# Patient Record
Sex: Female | Born: 1985 | Race: White | Hispanic: No | Marital: Single
Health system: Southern US, Community
[De-identification: ages and names within clinical notes are randomized; demographics above are authoritative.]

---
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
## 2020-01-31 ENCOUNTER — Other Ambulatory Visit: Payer: Self-pay

## 2020-01-31 ENCOUNTER — Ambulatory Visit (INDEPENDENT_AMBULATORY_CARE_PROVIDER_SITE_OTHER): Payer: Self-pay | Admitting: Otolaryngology

## 2020-01-31 DIAGNOSIS — R42 Dizziness and giddiness: Secondary | ICD-10-CM

## 2020-01-31 NOTE — Progress Notes (Signed)
HPI: Chelsea Campos is a 34 y.o. female who presents is referred by [facility] physicians for evaluation of dizziness.  Symptoms initially began following a flight to [place] that caused her to get motion sickness on 11/24/2019.  After she returned home she had persistent problems with dizziness with some nausea and tinnitus at night.  She describes some sensation of the room spinning.  She has undergone physical therapy and is doing better presently..  She is not having any hearing problems.  She has been treated with antibiotics as well as steroids in the past with minimal benefit. She is otherwise healthy.  No past medical history on file.  Social History   Socioeconomic History  . Marital status: Single    Spouse name: Not on file  . Number of children: Not on file  . Years of education: Not on file  . Highest education level: Not on file  Occupational History  . Not on file  Tobacco Use  . Smoking status: Not on file  Substance and Sexual Activity  . Alcohol use: Not on file  . Drug use: Not on file  . Sexual activity: Not on file  Other Topics Concern  . Not on file  Social History Narrative  . Not on file   Social Determinants of Health   Financial Resource Strain:   . Difficulty of Paying Living Expenses: Not on file  Food Insecurity:   . Worried About Programme researcher, broadcasting/film/video in the Last Year: Not on file  . Ran Out of Food in the Last Year: Not on file  Transportation Needs:   . Lack of Transportation (Medical): Not on file  . Lack of Transportation (Non-Medical): Not on file  Physical Activity:   . Days of Exercise per Week: Not on file  . Minutes of Exercise per Session: Not on file  Stress:   . Feeling of Stress : Not on file  Social Connections:   . Frequency of Communication with Friends and Family: Not on file  . Frequency of Social Gatherings with Friends and Family: Not on file  . Attends Religious Services: Not on file  . Active Member of Clubs or  Organizations: Not on file  . Attends Banker Meetings: Not on file  . Marital Status: Not on file   No family history on file. Not on File Prior to Admission medications   Not on File     Positive ROS: Otherwise negative  All other systems have been reviewed and were otherwise negative with the exception of those mentioned in the HPI and as above.  Physical Exam: Constitutional: Alert, well-appearing, no acute distress Ears: External ears without lesions or tenderness. Ear canals are clear bilaterally.  On microscopic exam of the ears TMs were clear bilaterally with no middle ear space abnormality noted.  TMs had normal mobility tympanometry.  Hearing screening with a tuning forks revealed good hearing in both ears which was symmetric.  Dix-Hallpike testing revealed no evidence of nystagmus or vertigo. Nasal: External nose without lesions.. Clear nasal passages Oral: Lips and gums without lesions. Tongue and palate mucosa without lesions. Posterior oropharynx clear. Neck: No palpable adenopathy or masses Respiratory: Breathing comfortably  Skin: No facial/neck lesions or rash noted.  Procedures  Assessment: Dizziness.  Could have been a mild labyrinthitis.  Plan: Did not recommend any further specific therapy at this point outside of physical therapy until symptoms improve.   Narda Bonds, MD   CC:

## 2020-06-05 ENCOUNTER — Other Ambulatory Visit: Payer: Self-pay | Admitting: Physician Assistant

## 2020-06-07 ENCOUNTER — Other Ambulatory Visit: Payer: Self-pay | Admitting: Physician Assistant

## 2020-06-07 DIAGNOSIS — R109 Unspecified abdominal pain: Secondary | ICD-10-CM

## 2020-06-08 ENCOUNTER — Other Ambulatory Visit: Payer: Self-pay

## 2020-06-09 ENCOUNTER — Other Ambulatory Visit: Payer: Self-pay | Admitting: Physician Assistant

## 2020-06-09 ENCOUNTER — Ambulatory Visit
Admission: RE | Admit: 2020-06-09 | Discharge: 2020-06-09 | Disposition: A | Discharge status: Home / Self Care | Payer: No Typology Code available for payment source | Source: Ambulatory Visit | Attending: Physician Assistant | Admitting: Physician Assistant

## 2020-06-09 DIAGNOSIS — R109 Unspecified abdominal pain: Secondary | ICD-10-CM

## 2020-06-12 ENCOUNTER — Other Ambulatory Visit: Payer: Self-pay | Admitting: Physician Assistant

## 2020-06-12 DIAGNOSIS — R109 Unspecified abdominal pain: Secondary | ICD-10-CM

## 2020-09-19 ENCOUNTER — Other Ambulatory Visit: Payer: Self-pay | Admitting: Family Medicine

## 2020-09-19 DIAGNOSIS — E041 Nontoxic single thyroid nodule: Secondary | ICD-10-CM

## 2020-09-28 ENCOUNTER — Ambulatory Visit
Admission: RE | Admit: 2020-09-28 | Discharge: 2020-09-28 | Disposition: A | Discharge status: Home / Self Care | Payer: No Typology Code available for payment source | Source: Ambulatory Visit | Attending: Family Medicine | Admitting: Family Medicine

## 2020-09-28 DIAGNOSIS — E041 Nontoxic single thyroid nodule: Secondary | ICD-10-CM

## 2020-10-03 ENCOUNTER — Other Ambulatory Visit: Payer: Self-pay | Admitting: Family Medicine

## 2020-10-03 DIAGNOSIS — E041 Nontoxic single thyroid nodule: Secondary | ICD-10-CM

## 2021-02-20 ENCOUNTER — Other Ambulatory Visit: Payer: Self-pay

## 2021-05-08 ENCOUNTER — Ambulatory Visit
Admission: RE | Admit: 2021-05-08 | Discharge: 2021-05-08 | Disposition: A | Discharge status: Home / Self Care | Payer: No Typology Code available for payment source | Source: Ambulatory Visit | Attending: Family Medicine | Admitting: Family Medicine

## 2021-05-08 DIAGNOSIS — E041 Nontoxic single thyroid nodule: Secondary | ICD-10-CM

## 2021-05-09 ENCOUNTER — Other Ambulatory Visit: Payer: Self-pay | Admitting: Family Medicine

## 2021-05-09 DIAGNOSIS — E049 Nontoxic goiter, unspecified: Secondary | ICD-10-CM

## 2021-10-30 IMAGING — CT CT ABD-PELV W/O CM
2 of 4 series · 13 of 46 positions shown, 15 images · non-contrast
Comparison: None.

CLINICAL DATA: Right flank pain for 2 weeks.

EXAM:
CT ABDOMEN AND PELVIS WITHOUT CONTRAST
TECHNIQUE: Multidetector CT imaging of the abdomen and pelvis was performed
following the standard protocol without IV contrast.

[Series 2: renal stone 5.00 br40 s3 axial · axial · 0.63mm/px · z∈[+1219,+1614]mm · 10 of 95 slices shown, 12 images]
[im 8/95  soft-tissue]
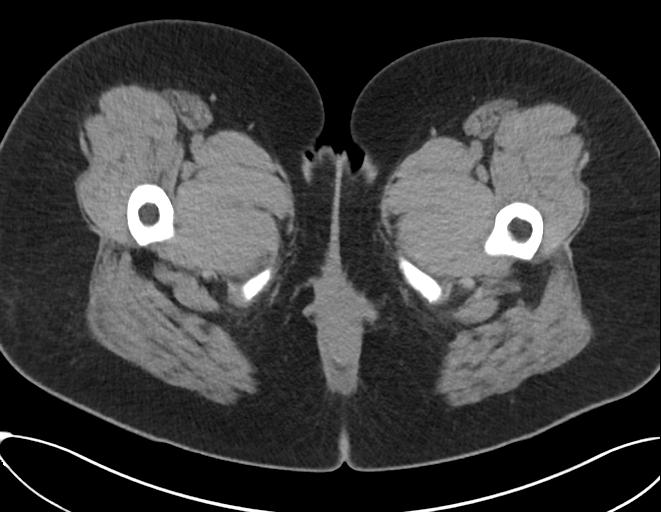
[im 8/95  bone]
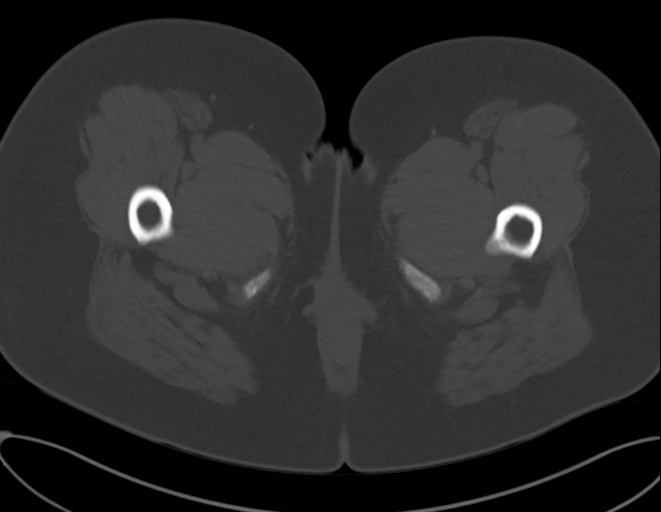
[im 16/95  soft-tissue]
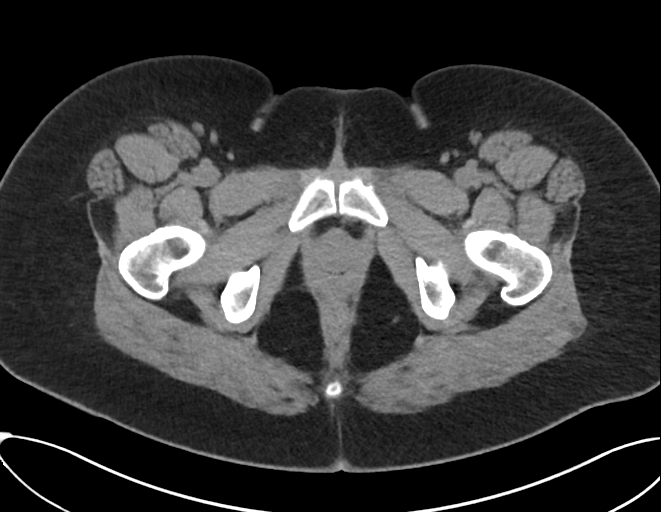
[im 27/95  soft-tissue]
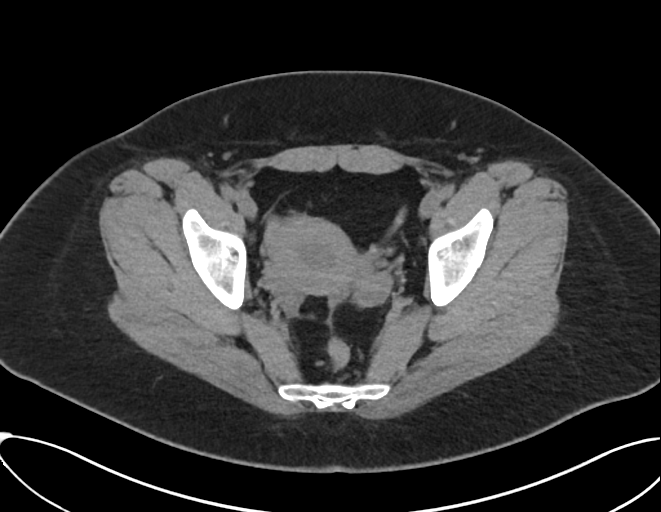
[im 34/95  soft-tissue]
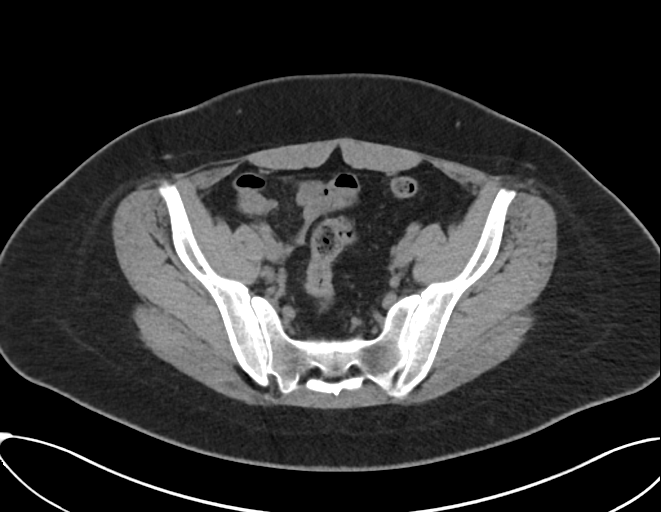
[im 42/95  soft-tissue]
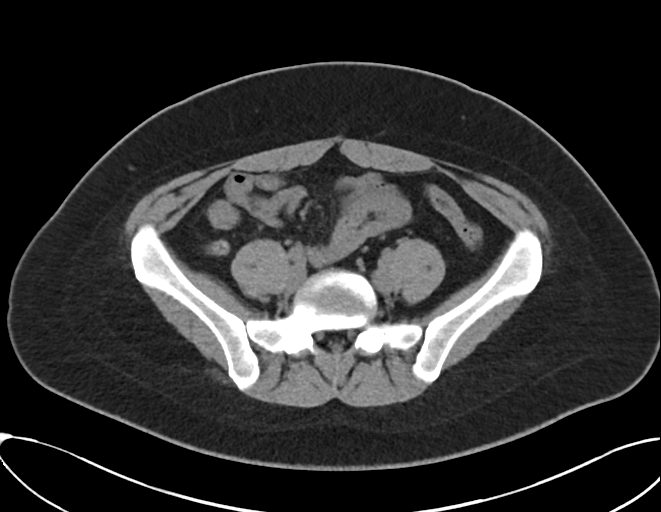
[im 53/95  soft-tissue]
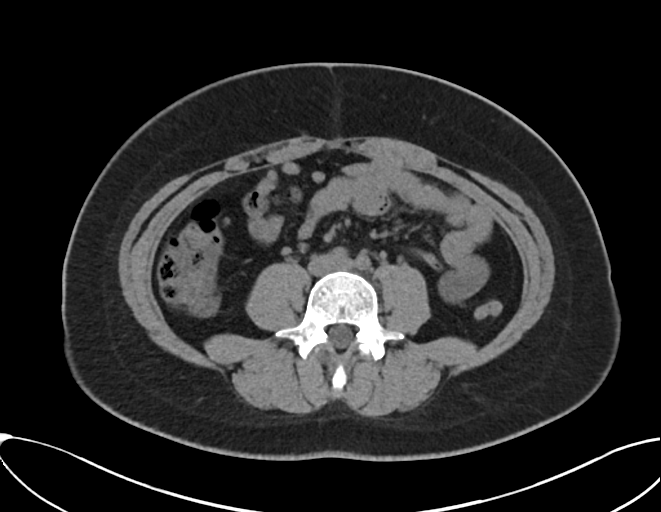
[im 61/95  soft-tissue]
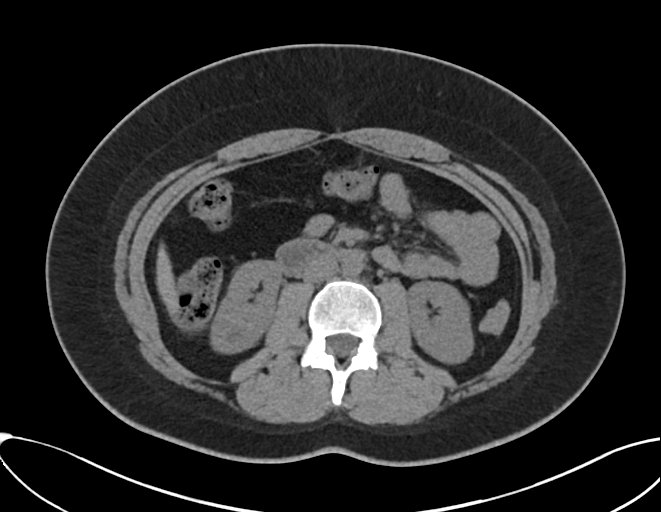
[im 72/95  soft-tissue]
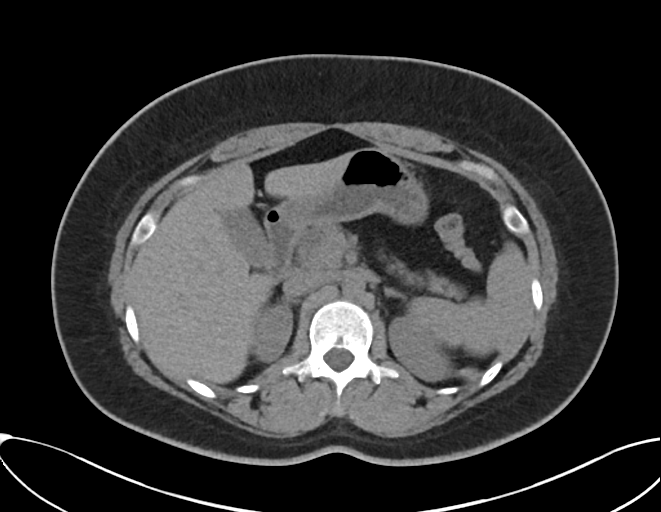
[im 79/95  soft-tissue]
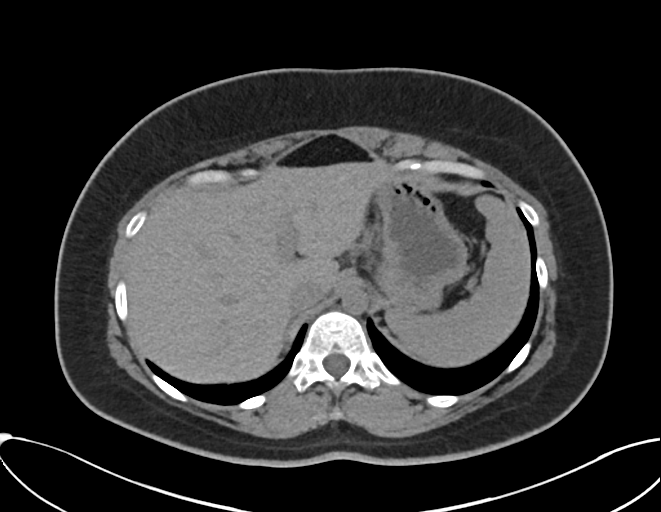
[im 79/95  bone]
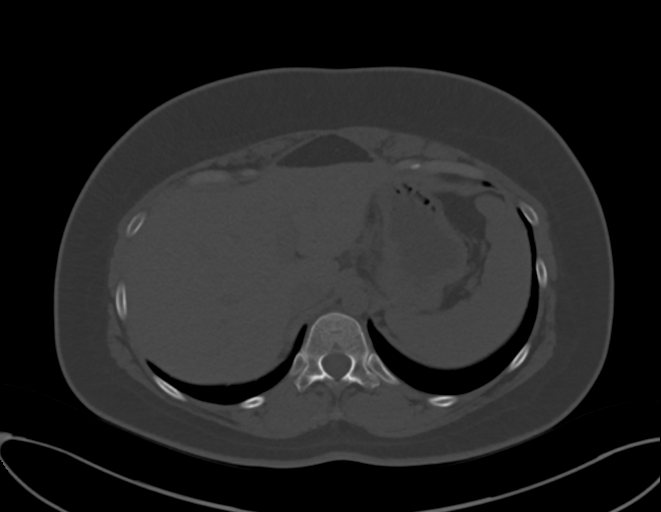
[im 87/95  soft-tissue]
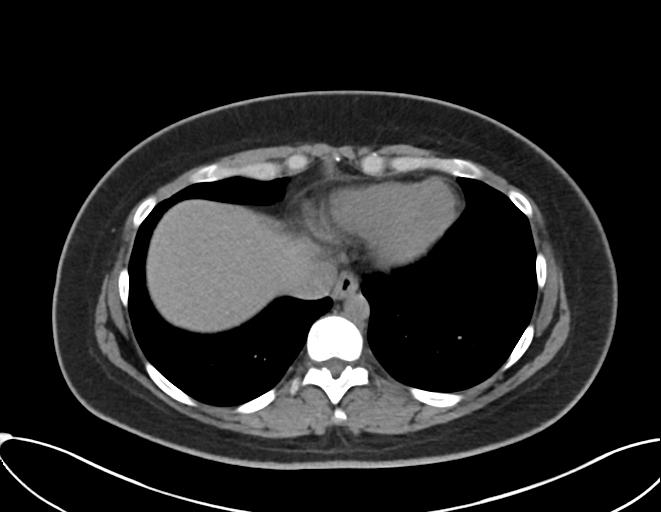

[Series 6: renal stone 2.00 br40 s3 cor · coronal · 0.81mm/px · 3 of 161 slices shown]
[im 54/161  soft-tissue]
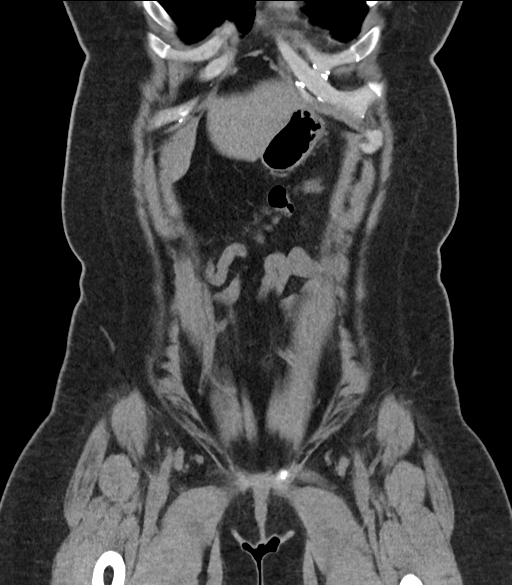
[im 72/161  soft-tissue]
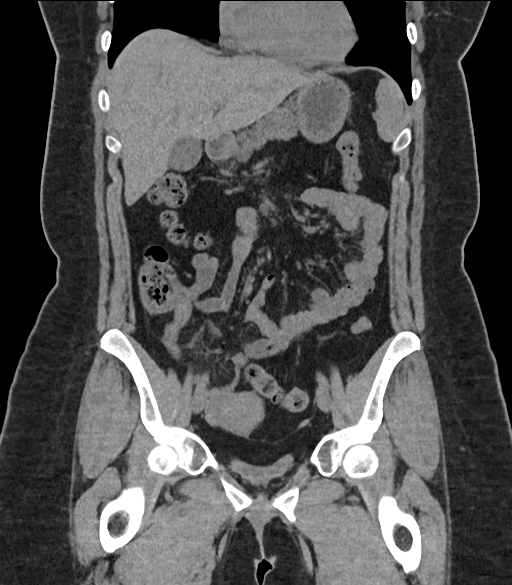
[im 89/161  soft-tissue]
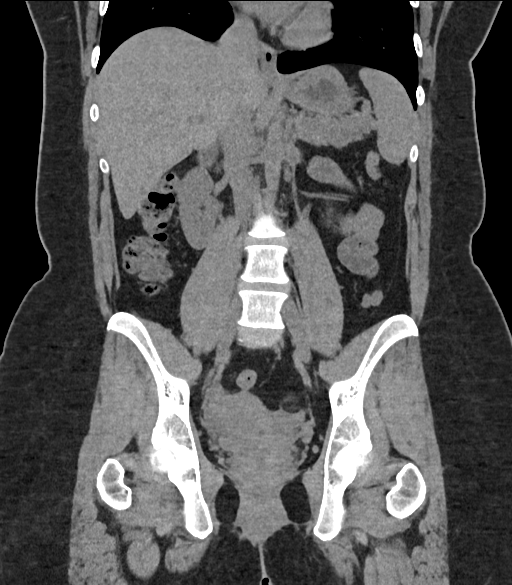

[13 of 46 positions shown; findings below may reference images not displayed]

FINDINGS: Lower chest: The lung bases are clear of acute process. No pleural
effusion or pulmonary lesions. The heart is normal in size. No
pericardial effusion. The distal esophagus and aorta are
unremarkable.

Hepatobiliary: No hepatic lesions or intrahepatic biliary
dilatation. The gallbladder is normal. No common bile duct
dilatation.

Pancreas: No mass, inflammation or ductal dilatation.

Spleen: Normal size.  No focal lesions.

Adrenals/Urinary Tract: The adrenal glands are normal.

No renal, ureteral or bladder calculi or mass. Low-attenuation
lesion in the inferior aspect of the right kidney is consistent with
a benign cyst. No perinephric fluid collections.

Stomach/Bowel: The stomach, duodenum, small bowel and colon are
grossly normal without oral contrast. No inflammatory changes, mass
lesions or obstructive findings. The appendix is normal.

Vascular/Lymphatic: The aorta is normal in caliber. No
atheroscerlotic calcifications. No mesenteric of retroperitoneal
mass or adenopathy. Small scattered lymph nodes are noted.

Reproductive: The uterus and ovaries are unremarkable.

Other: No pelvic mass or adenopathy. No free pelvic fluid
collections. No inguinal mass or adenopathy. No abdominal wall
hernia or subcutaneous lesions.

Musculoskeletal: No significant bony findings.
IMPRESSION: 1. No acute abdominal/pelvic findings, mass lesions or adenopathy.
2. No renal, ureteral or bladder calculi or mass.

## 2022-02-18 IMAGING — US US THYROID
1 series · 13 of 25 positions shown · non-contrast
Comparison: None.

CLINICAL DATA: Thyroid nodule

EXAM:
THYROID ULTRASOUND
TECHNIQUE: Ultrasound examination of the thyroid gland and adjacent soft
tissues was performed.

[Series 1: us thyroid · 0.06mm/px · 13 of 36 slices shown]
[im 1/36]
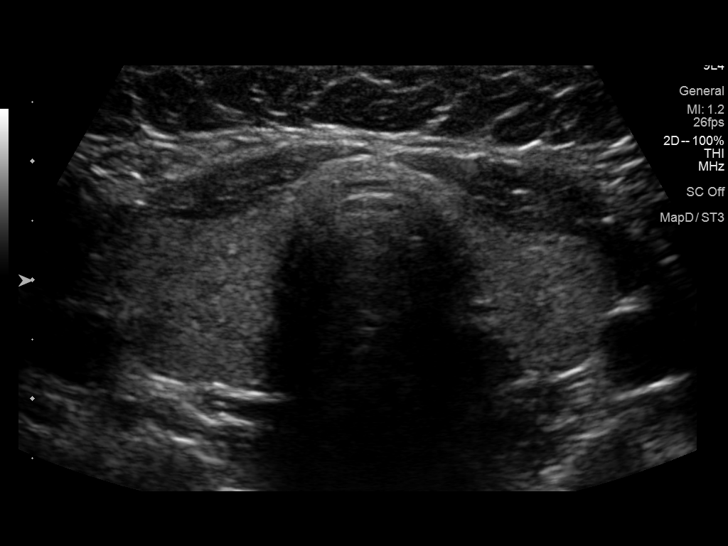
[im 3/36]
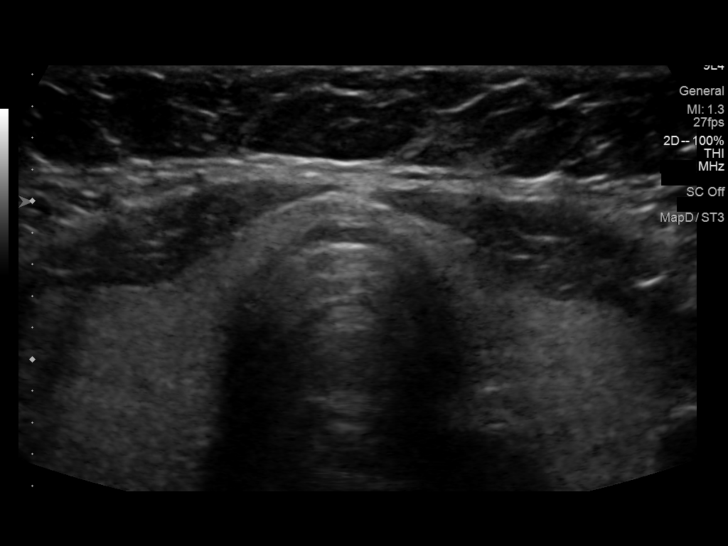
[im 6/36]
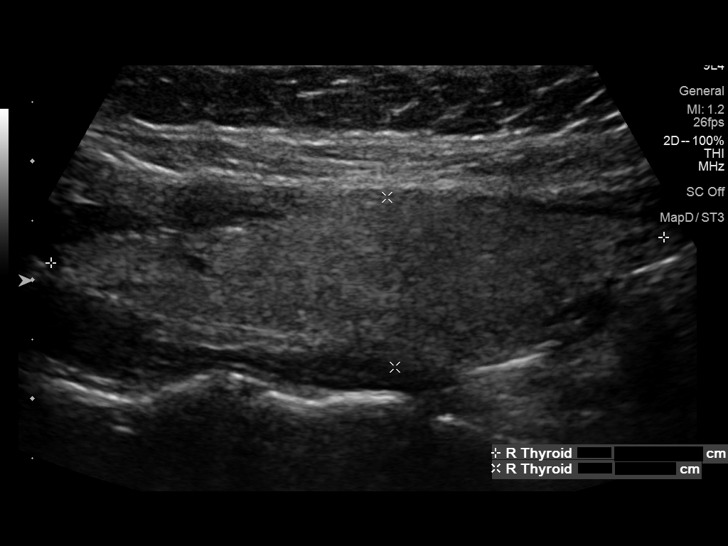
[im 9/36]
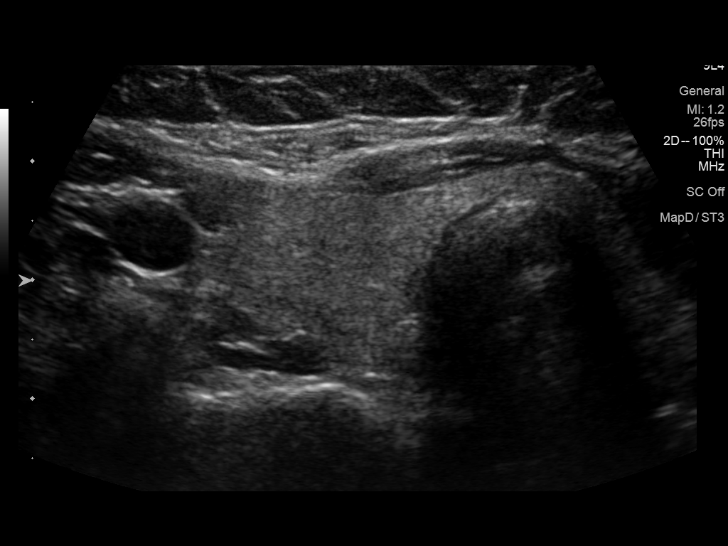
[im 12/36]
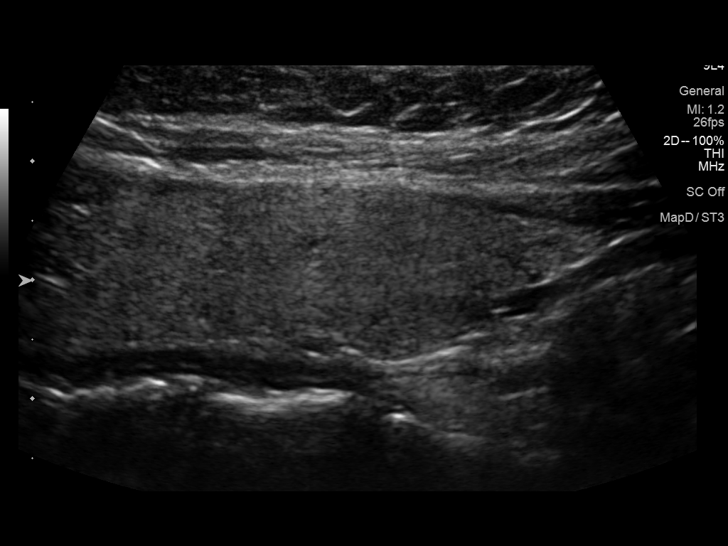
[im 15/36]
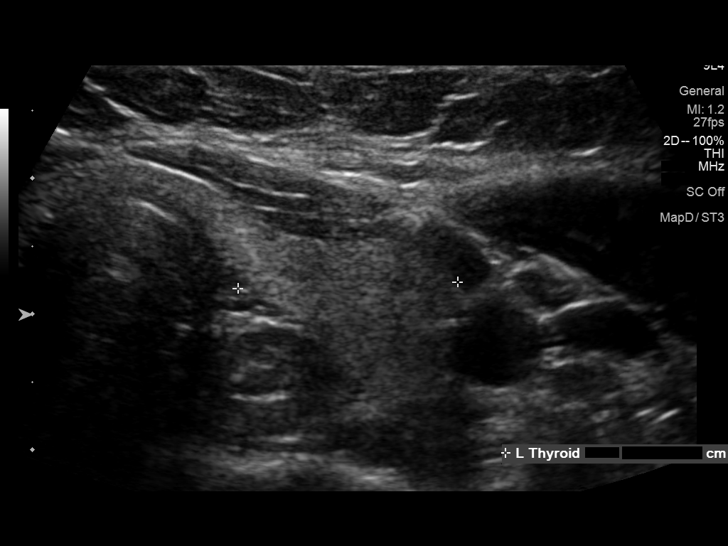
[im 18/36]
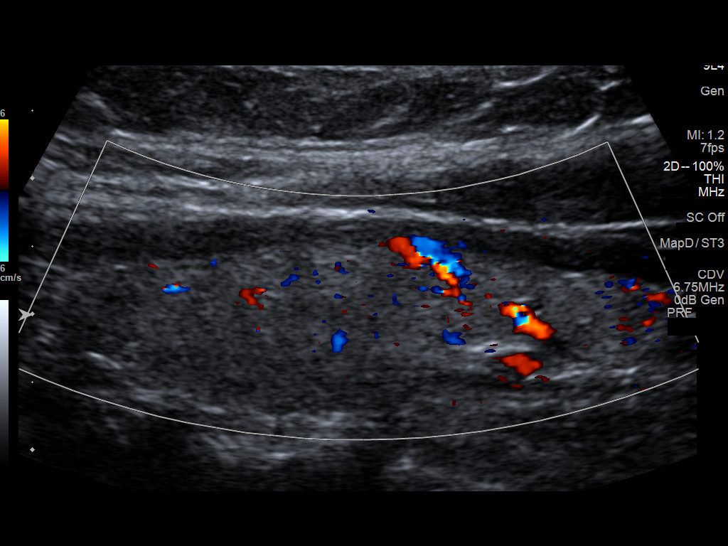
[im 21/36]
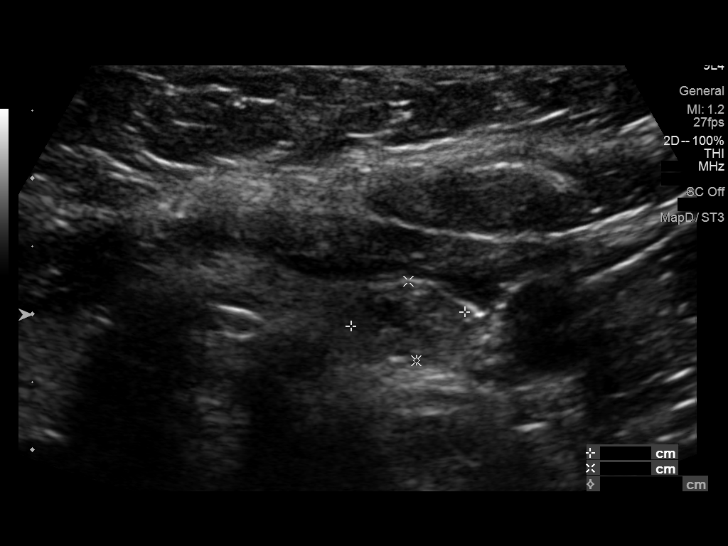
[im 24/36]
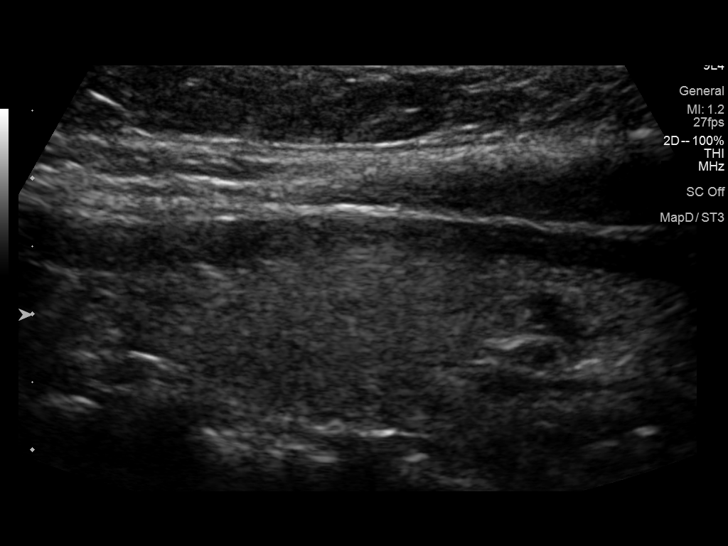
[im 27/36]
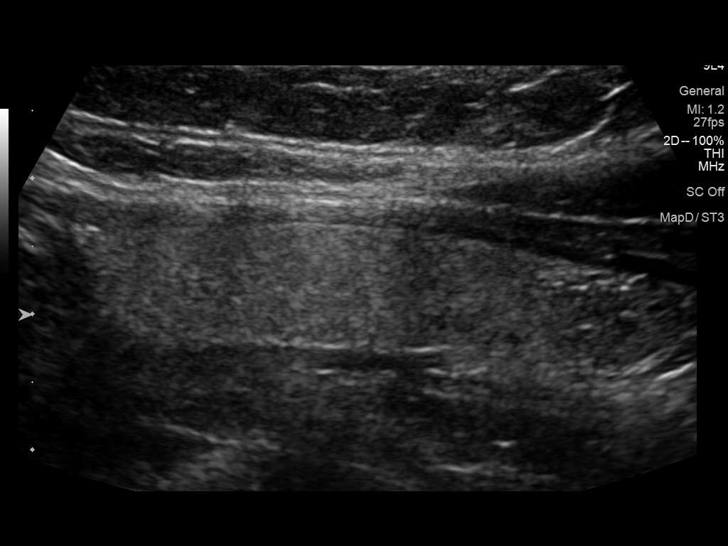
[im 30/36]
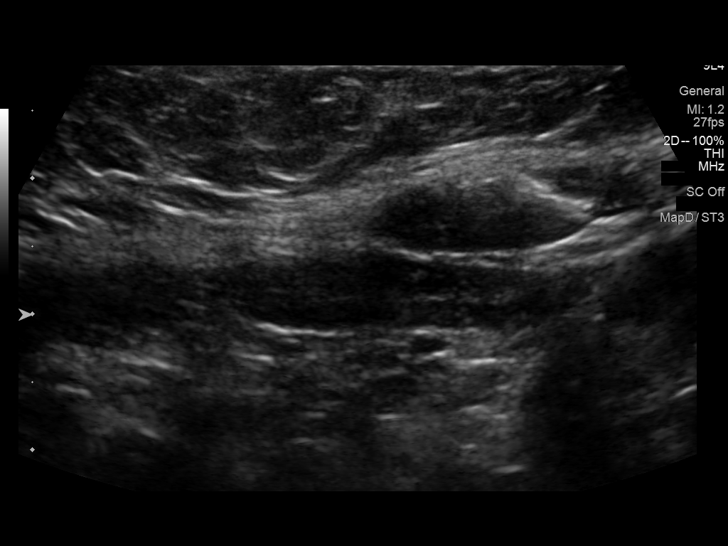
[im 33/36]
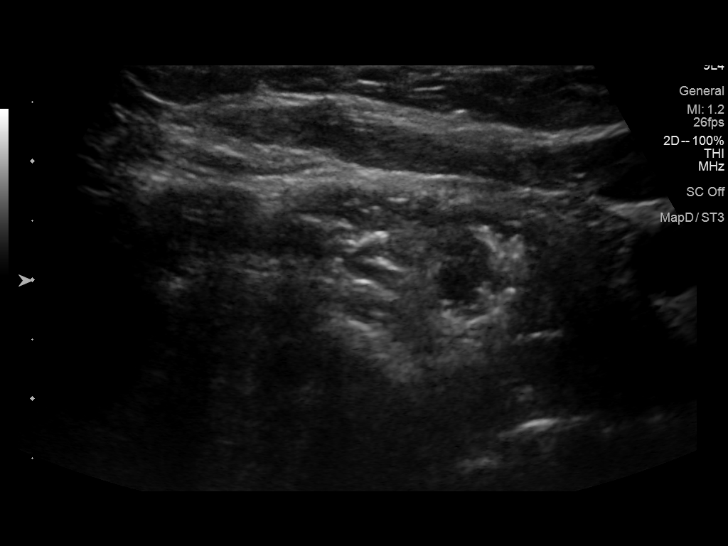
[im 36/36]
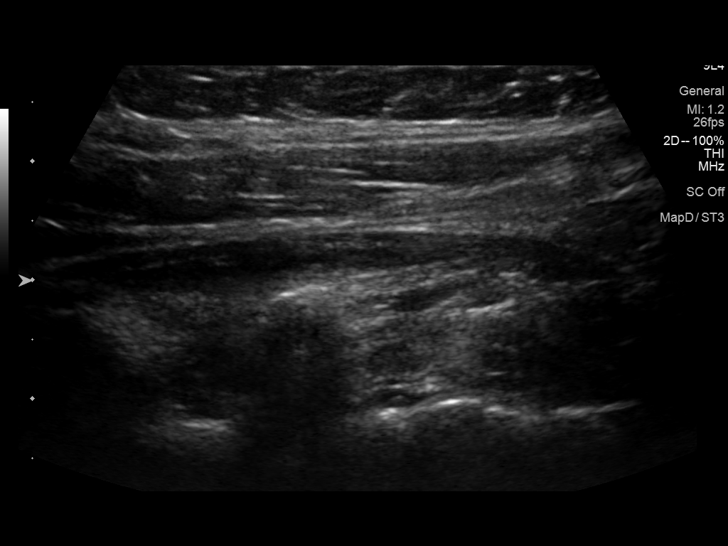

[13 of 25 positions shown; findings below may reference images not displayed]

FINDINGS: Parenchymal Echotexture: Mildly heterogeneous

Isthmus: 0.4 cm

Right lobe: 5.2 x 1.4 x 1.8 cm

Left lobe: 5.0 x 1.0 x 1.6 cm

_________________________________________________________

Estimated total number of nodules >/= 1 cm: 0

Number of spongiform nodules >/=  2 cm not described below (TR1): 0

Number of mixed cystic and solid nodules >/= 1.5 cm not described
below (TR2): 0

_________________________________________________________

Nodule # 1:

Location: Left; inferior

Maximum size: 0.9 cm; Other 2 dimensions: 0.8 x 0.6 cm

Composition: solid/almost completely solid (2)

Echogenicity: hypoechoic (2)

Shape: not taller-than-wide (0)

Margins: smooth (0)

Echogenic foci: none (0)

ACR TI-RADS total points: 4.

ACR TI-RADS risk category: TR4 (4-6 points).

ACR TI-RADS recommendations:

Given size (<0.9 cm) and appearance, this nodule does NOT meet
TI-RADS criteria for biopsy or dedicated follow-up.

_________________________________________________________
IMPRESSION: Solitary subcentimeter nodule in the inferior tip of the left
thyroid lobe does not meet criteria for FNA or surveillance.

The above is in keeping with the ACR TI-RADS recommendations - [HOSPITAL] 4505;[DATE].

## 2022-09-28 IMAGING — US US THYROID
1 series · 13 of 25 positions shown · non-contrast
Comparison: None.

CLINICAL DATA: Prior ultrasound follow-up. Follow-up thyroid
nodules.

EXAM:
THYROID ULTRASOUND
TECHNIQUE: Ultrasound examination of the thyroid gland and adjacent soft
tissues was performed.

[Series 1: us thyroid · 0.05mm/px · 13 of 80 slices shown]
[im 1/80]
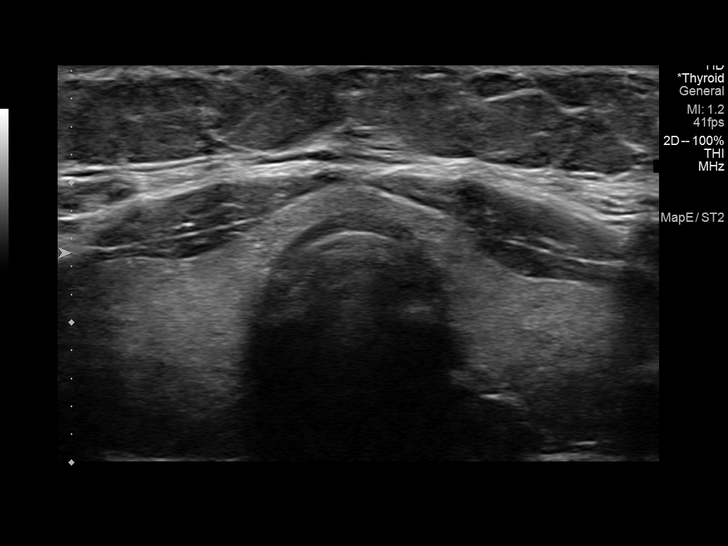
[im 7/80]
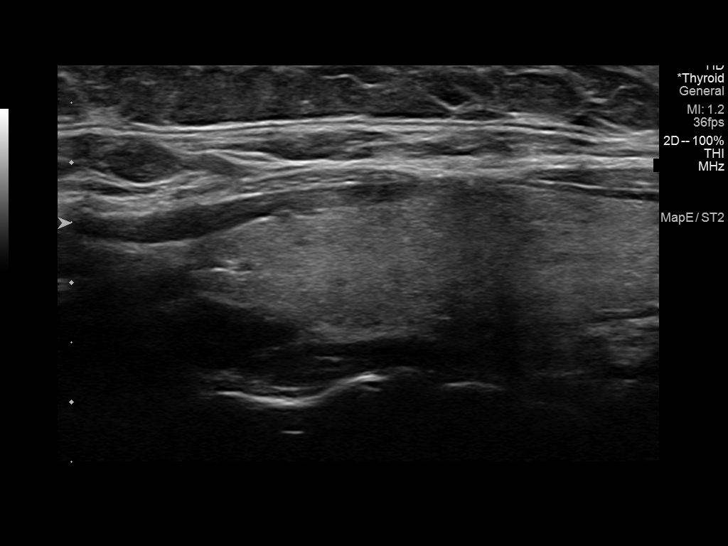
[im 14/80]
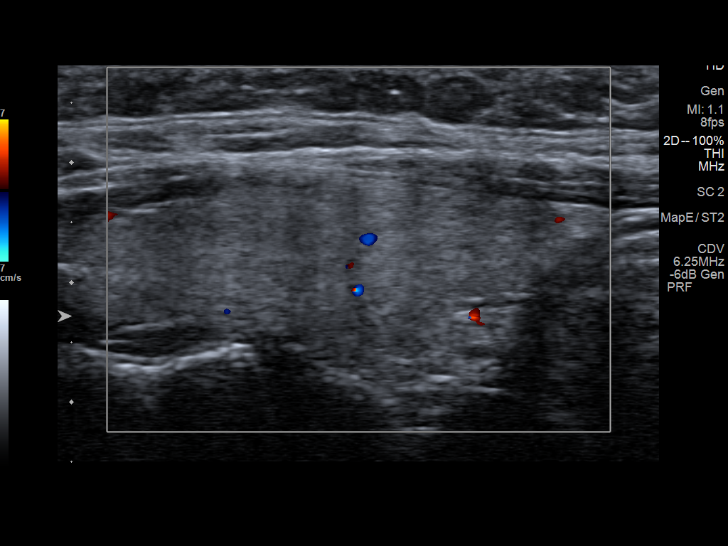
[im 20/80]
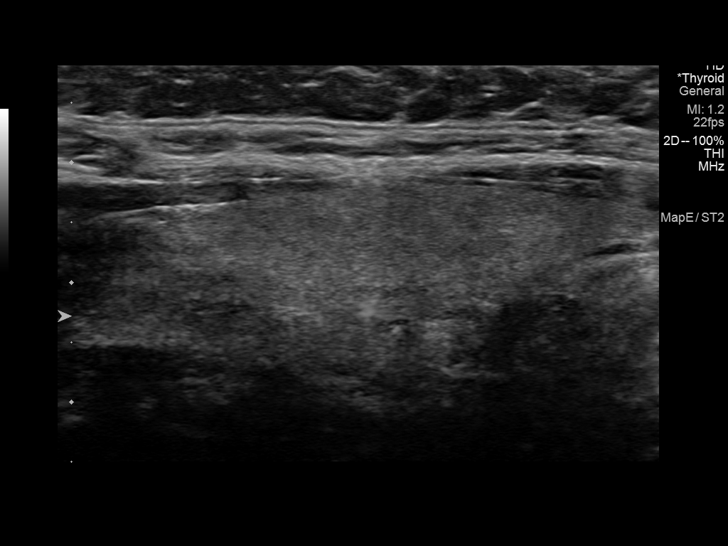
[im 27/80]
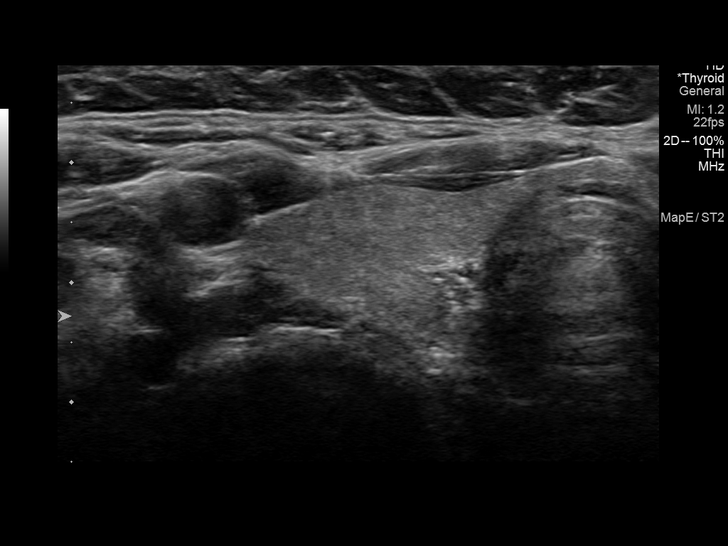
[im 33/80]
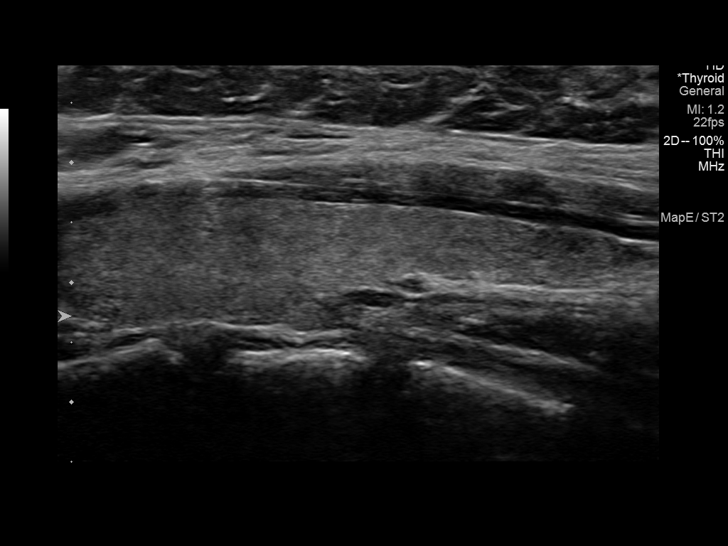
[im 40/80]
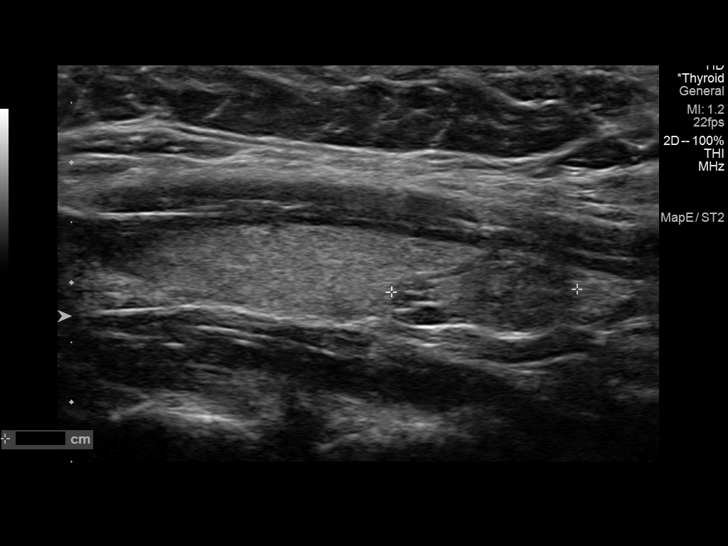
[im 47/80]
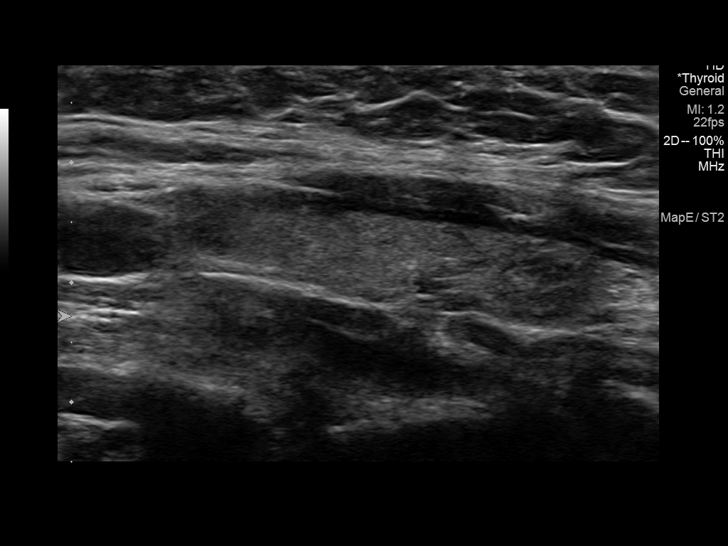
[im 53/80]
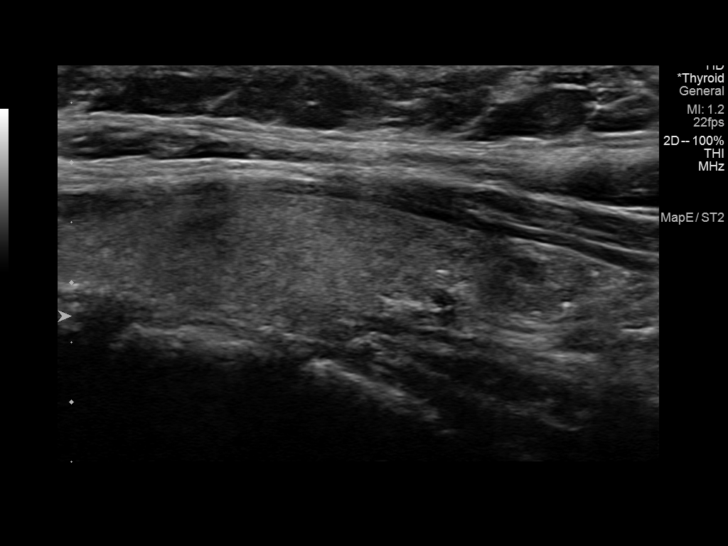
[im 60/80]
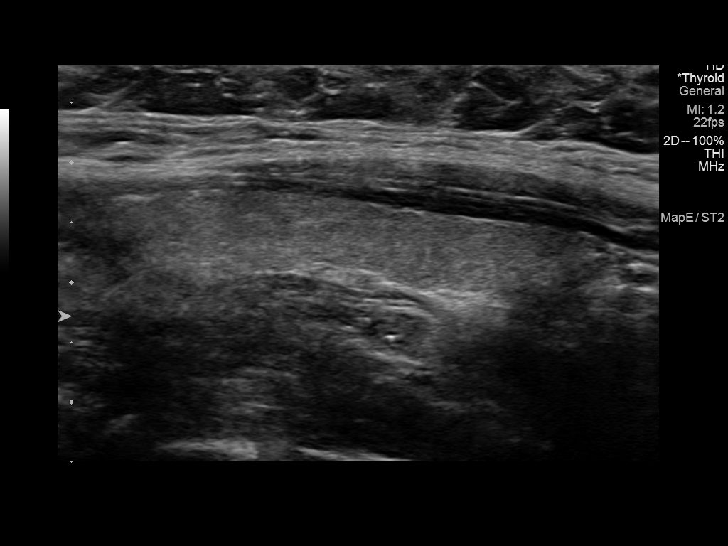
[im 66/80]
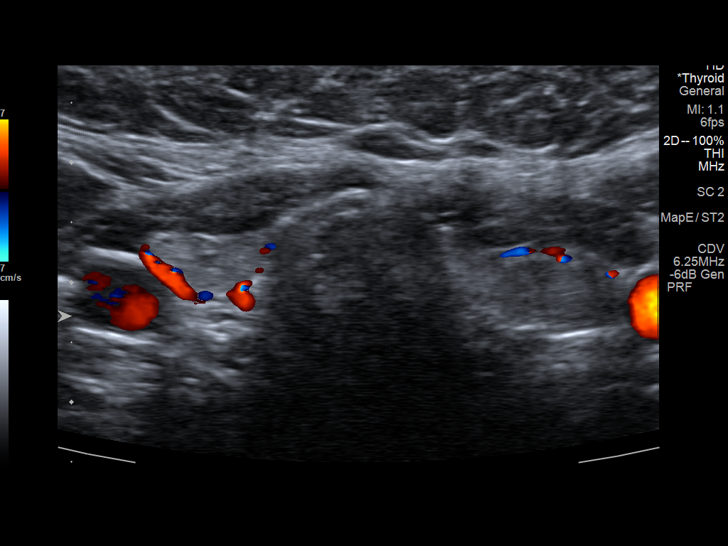
[im 73/80]
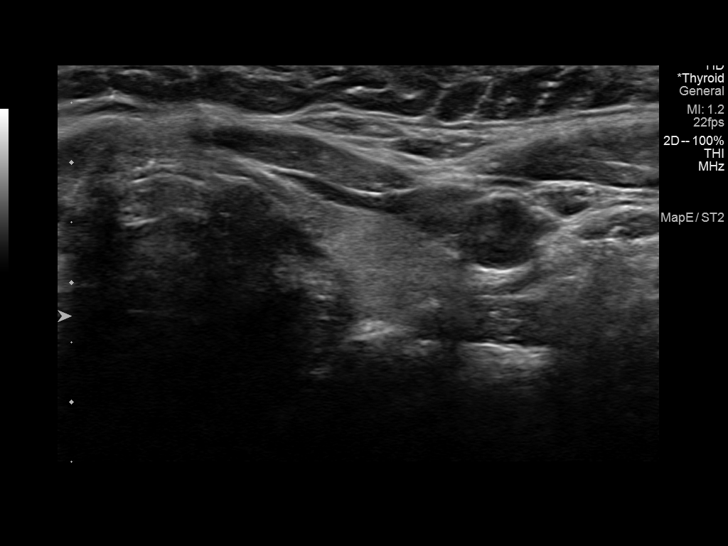
[im 80/80]
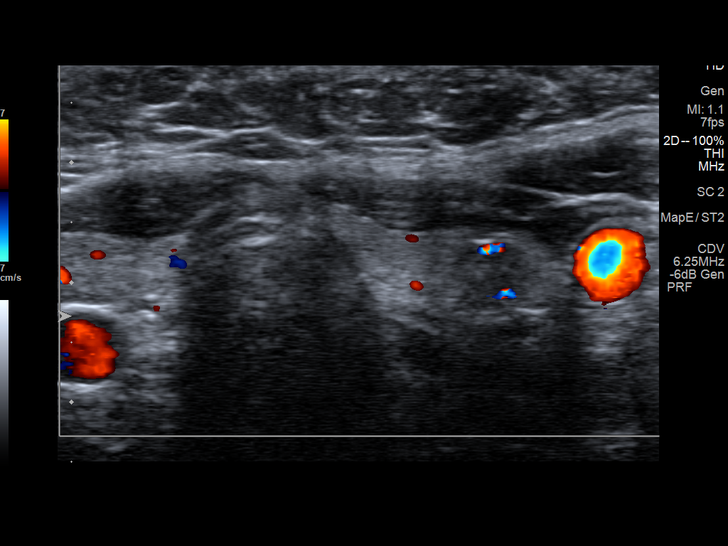

[13 of 25 positions shown; findings below may reference images not displayed]

FINDINGS: Parenchymal Echotexture: Mildly heterogenous

Isthmus: Normal in size measuring 0.3 cm in diameter, unchanged

Right lobe: Normal in size measuring 5.0 x 2.0 x 1.3 cm, previously,
5.2 x 1.8 x 1.4 cm

Left lobe: Normal in size measuring 4.8 x 1.6 x 1.1 cm, previously,
5.0 x 1.6 x 1.0 cm

_________________________________________________________

Estimated total number of nodules >/= 1 cm: 1

Number of spongiform nodules >/=  2 cm not described below (TR1): 0

Number of mixed cystic and solid nodules >/= 1.5 cm not described
below (TR2): 0

_________________________________________________________

There is a punctate (approximate 0.2 cm) dystrophic shadowing
calcification within mid aspect the right lobe of the thyroid
without associated discrete nodule.

_________________________________________________________

Nodule # 1:

Prior biopsy: No

Location: Left; Inferior

Maximum size: 1.6 cm; Other 2 dimensions: 1.0 x 0.7 cm, previously,
0.9 x 0.8 x 0.6 cm

Composition: solid/almost completely solid (2)

Echogenicity: hypoechoic (2)

Shape: not taller-than-wide (0)

Margins: ill-defined (0)

Echogenic foci: none (0)

ACR TI-RADS total points: 4.

ACR TI-RADS risk category:  TR4 (4-6 points).

Significant change in size (>/= 20% in two dimensions and minimal
increase of 2 mm): Yes

Change in features: No

Change in ACR TI-RADS risk category: No

ACR TI-RADS recommendations:

**Given size (>/= 1.5 cm) and appearance, fine needle aspiration of
this moderately suspicious nodule should be considered based on
TI-RADS criteria.

_________________________________________________________
IMPRESSION: Interval enlargement of solitary now 1.6 cm TR4 nodule within the
inferior pole of the left lobe of the thyroid which now meets
imaging criteria to recommend percutaneous sampling as indicated.

The above is in keeping with the ACR TI-RADS recommendations - [HOSPITAL] 3021;[DATE].

## 2023-02-25 ENCOUNTER — Other Ambulatory Visit: Payer: Self-pay | Admitting: Family Medicine

## 2023-02-25 DIAGNOSIS — E041 Nontoxic single thyroid nodule: Secondary | ICD-10-CM

## 2023-03-06 ENCOUNTER — Ambulatory Visit
Admission: RE | Admit: 2023-03-06 | Discharge: 2023-03-06 | Disposition: A | Discharge status: Home / Self Care | Payer: No Typology Code available for payment source | Source: Ambulatory Visit | Attending: Family Medicine | Admitting: Family Medicine

## 2023-03-06 ENCOUNTER — Other Ambulatory Visit: Payer: Self-pay

## 2023-03-06 DIAGNOSIS — E041 Nontoxic single thyroid nodule: Secondary | ICD-10-CM

## 2024-02-03 ENCOUNTER — Other Ambulatory Visit: Payer: Self-pay | Admitting: Family Medicine

## 2024-02-03 DIAGNOSIS — E041 Nontoxic single thyroid nodule: Secondary | ICD-10-CM

## 2024-03-17 ENCOUNTER — Ambulatory Visit
Admission: RE | Admit: 2024-03-17 | Discharge: 2024-03-17 | Disposition: A | Discharge status: Home / Self Care | Payer: Self-pay | Source: Ambulatory Visit | Attending: Family Medicine | Admitting: Family Medicine

## 2024-03-17 DIAGNOSIS — E041 Nontoxic single thyroid nodule: Secondary | ICD-10-CM
